# Patient Record
Sex: Male | Born: 1995 | Race: White | Hispanic: No | Marital: Single | Smoking: Current every day smoker
Health system: Southern US, Community
[De-identification: ages and names within clinical notes are randomized; demographics above are authoritative.]

## PROBLEM LIST (undated history)

## (undated) DIAGNOSIS — K5090 Crohn's disease, unspecified, without complications: Secondary | ICD-10-CM

## (undated) HISTORY — PX: EYE SURGERY: SHX253

---
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
## 2015-03-22 IMAGING — US US SCROTUM
1 series · 14 of 25 positions shown · non-contrast
Comparison: None.

CLINICAL DATA: Testicular mass measuring 5 mm upper right testicle

EXAM:
ULTRASOUND OF SCROTUM
TECHNIQUE: Complete ultrasound examination of the testicles, epididymis, and
other scrotal structures was performed.

[Series 1: us scrotum · 0.08mm/px · 14 of 41 slices shown]
[im 1/41]
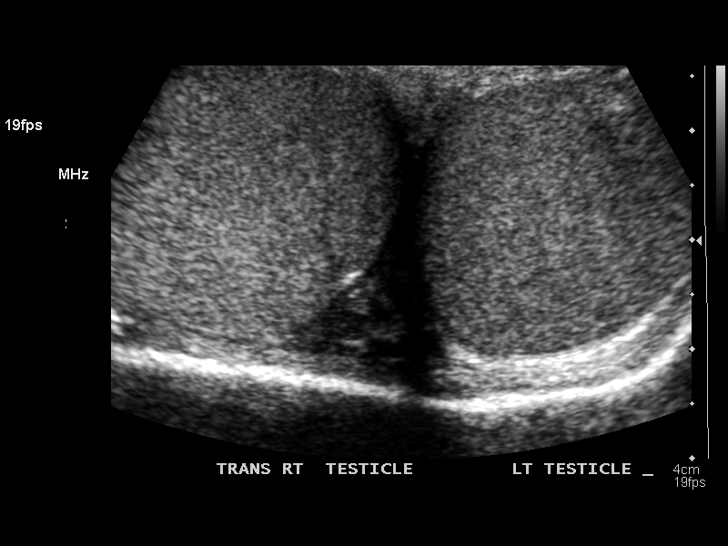
[im 4/41]
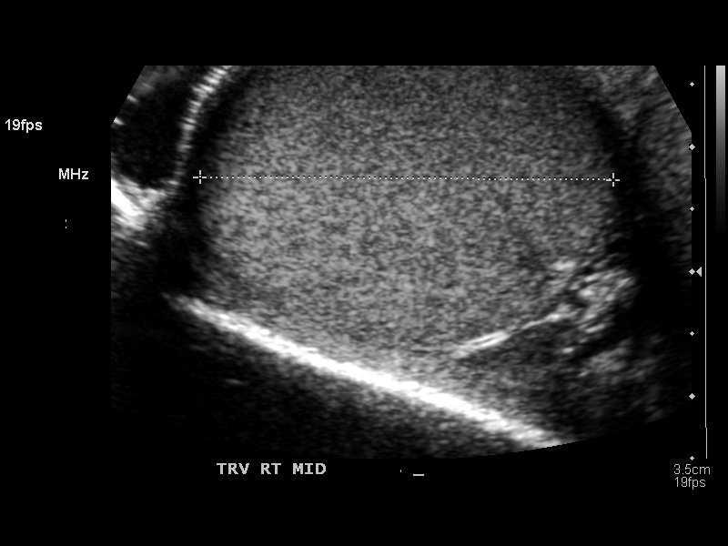
[im 7/41]
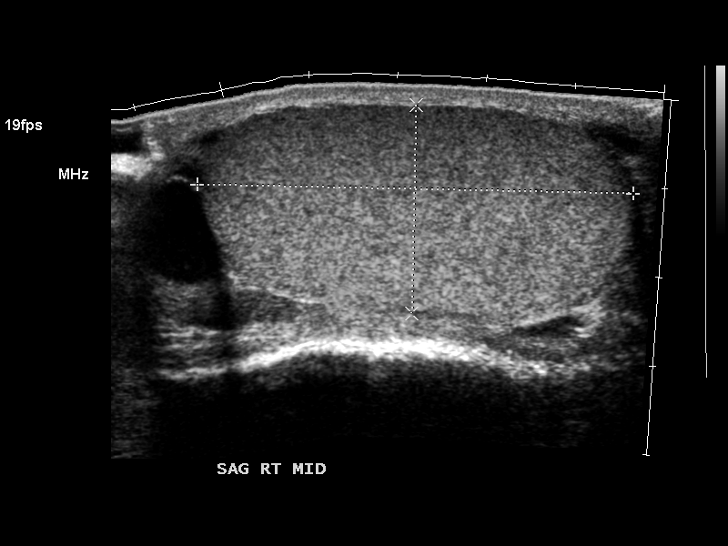
[im 11/41]
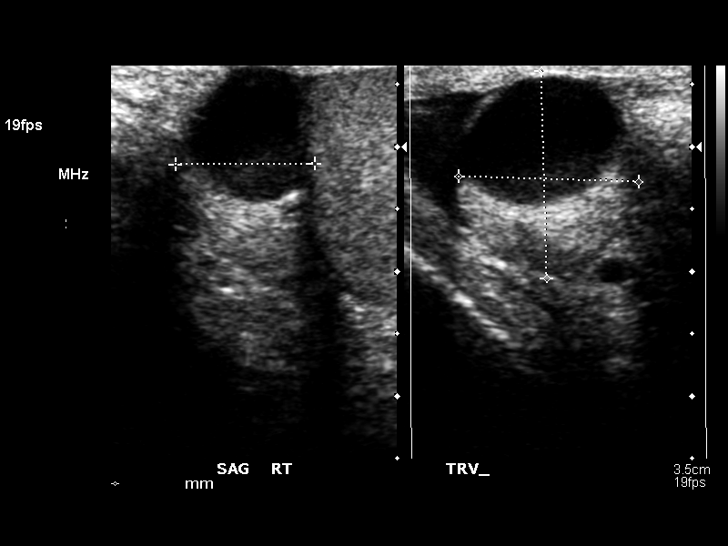
[im 14/41]
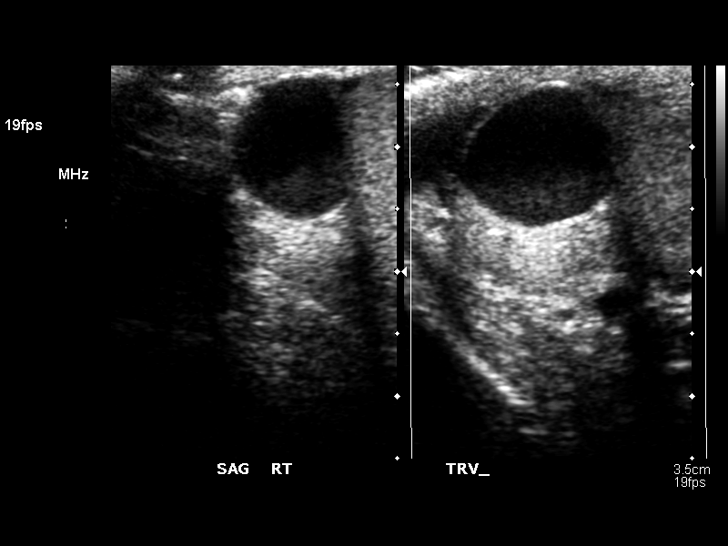
[im 16/41]
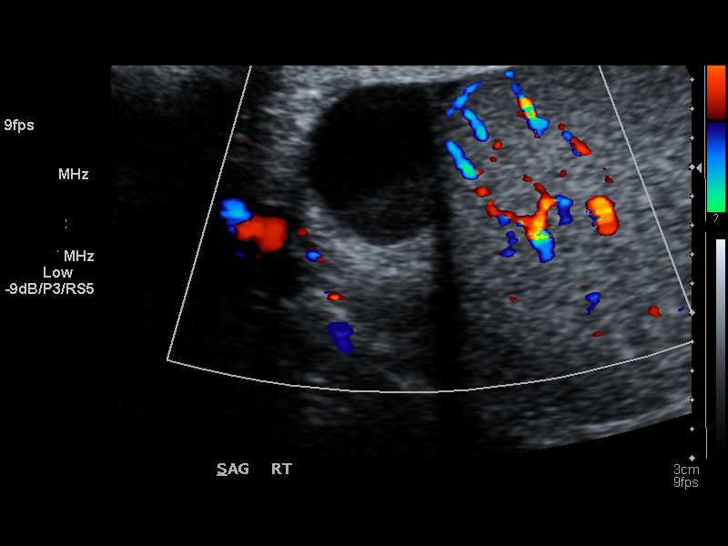
[im 19/41]
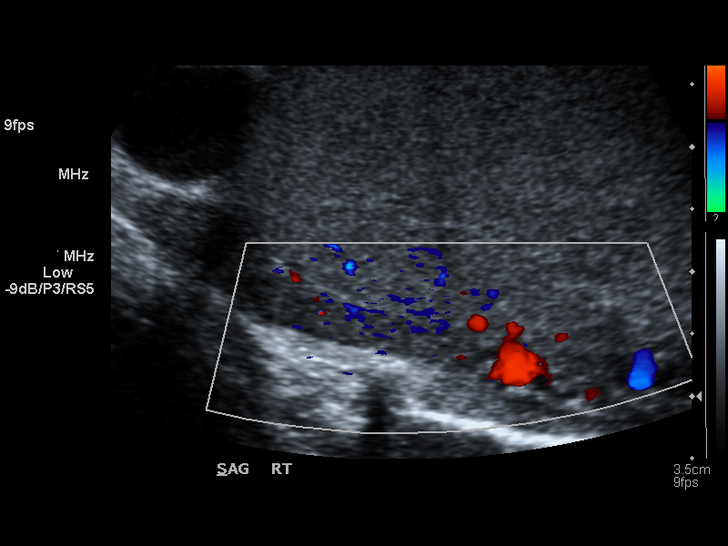
[im 22/41]
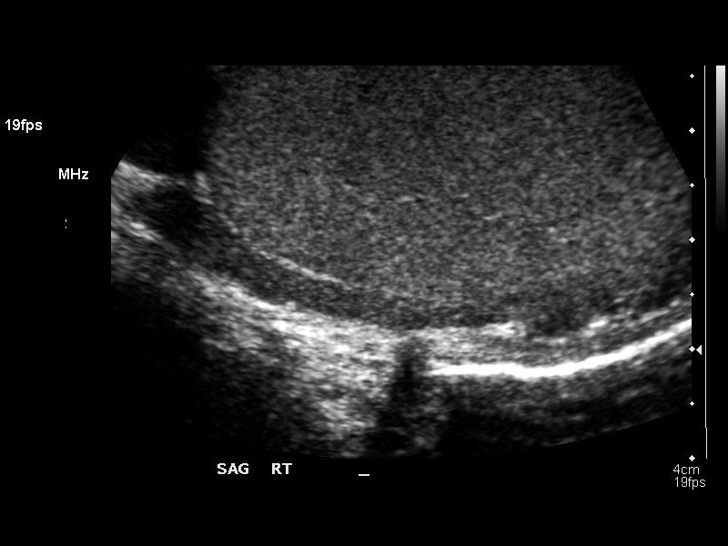
[im 26/41]
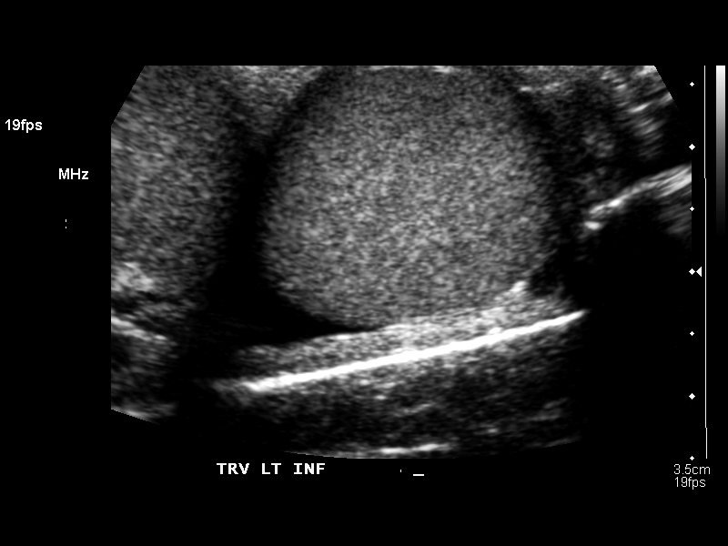
[im 27/41]
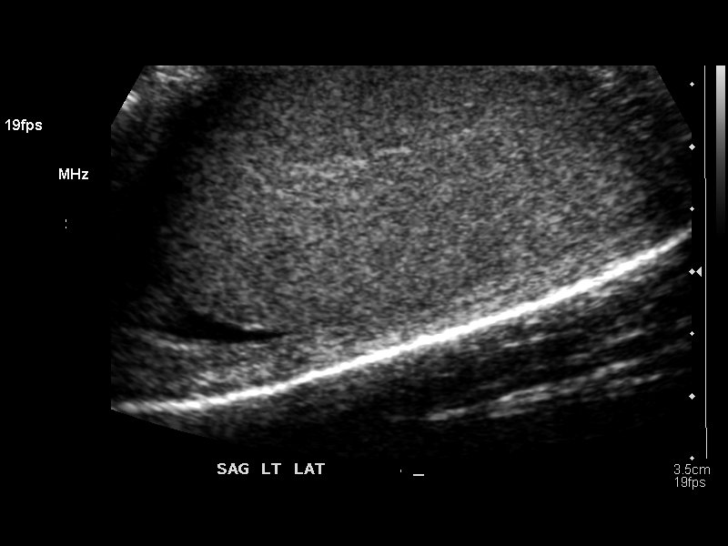
[im 31/41]
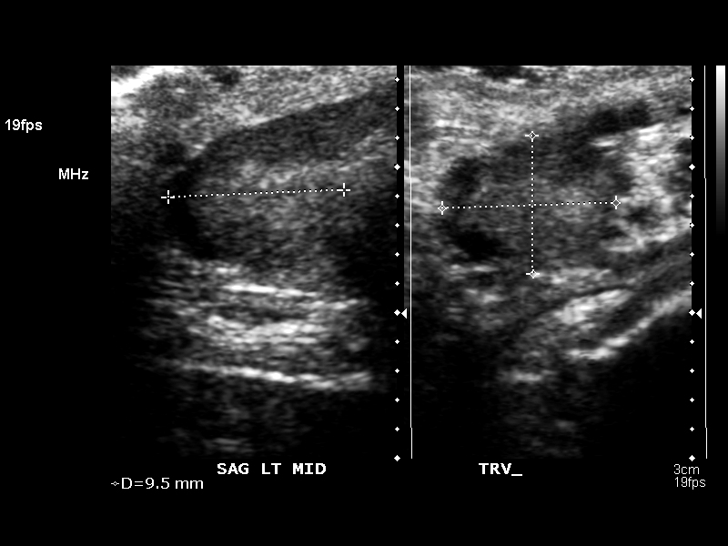
[im 34/41]
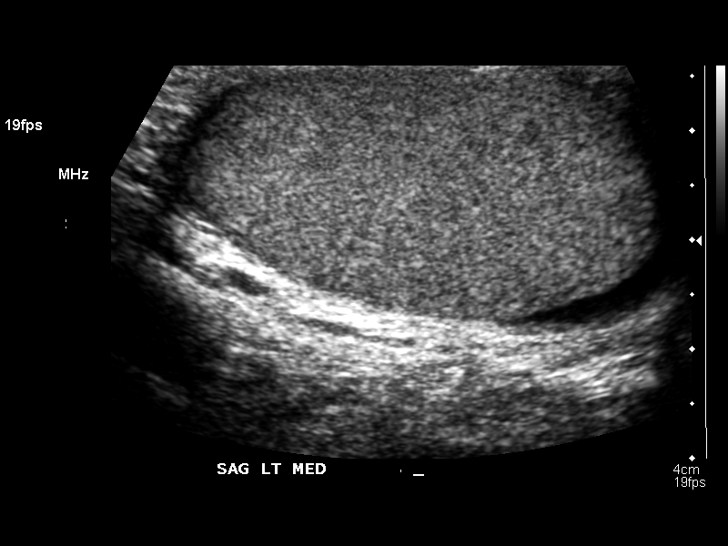
[im 37/41]
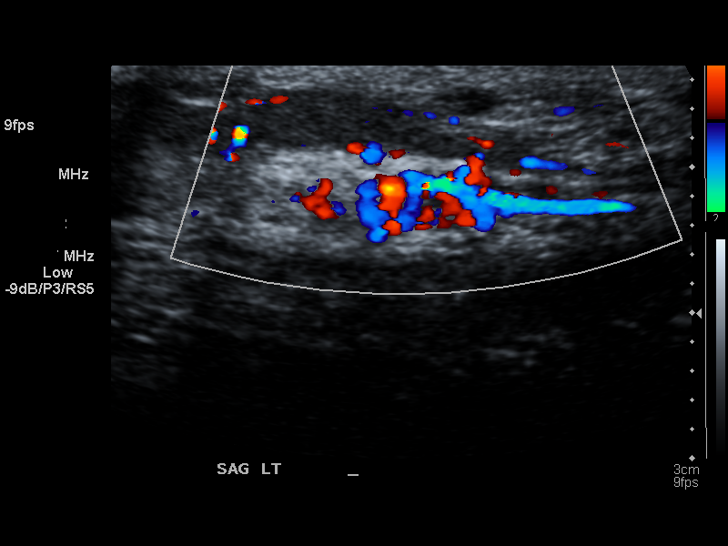
[im 41/41]
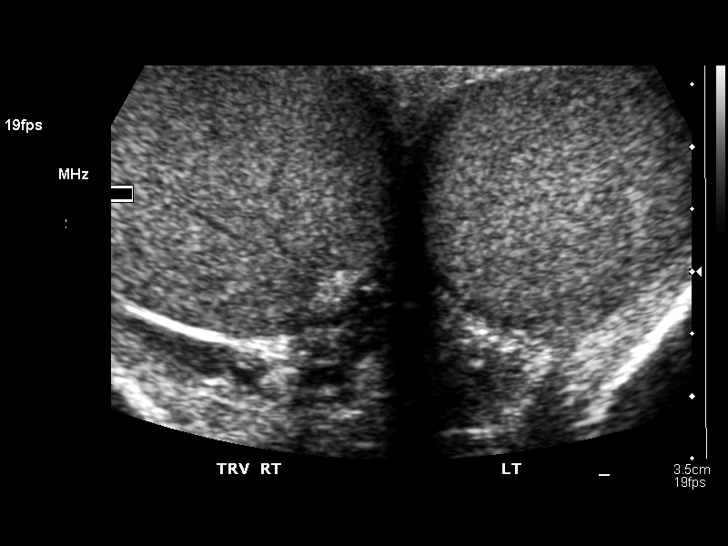

[14 of 25 positions shown; findings below may reference images not displayed]

FINDINGS: Right testicle

Measurements: 49 x 23 x 33 mm. No mass or microlithiasis visualized.

Left testicle

Measurements: 43 x 24 x 29 mm. No mass or microlithiasis visualized.

Right epididymis: The palpable lesion corresponds to a mildly
complicated cyst in the epididymal head measuring 12 mm in diameter,
showing mildly echogenic material layering dependently with no blood
flow internally

Left epididymis:  Normal in size and appearance.

Hydrocele:  None visualized.

Varicocele:  None visualized.
IMPRESSION: Testicles are normal. There is a palpable right epididymal head
cyst.
